# Patient Record
Sex: Male | Born: 1980 | Race: White | Hispanic: No | Smoking: Current every day smoker
Health system: Southern US, Community
[De-identification: ages and names within clinical notes are randomized; demographics above are authoritative.]

---

## 2014-03-05 ENCOUNTER — Encounter (HOSPITAL_COMMUNITY): Payer: Self-pay | Admitting: Emergency Medicine

## 2014-03-05 ENCOUNTER — Emergency Department (HOSPITAL_COMMUNITY)
Admission: EM | Admit: 2014-03-05 | Discharge: 2014-03-05 | Disposition: A | Discharge status: Home / Self Care | Payer: Self-pay | Attending: Emergency Medicine | Admitting: Emergency Medicine

## 2014-03-05 ENCOUNTER — Emergency Department (HOSPITAL_COMMUNITY): Payer: Self-pay

## 2014-03-05 DIAGNOSIS — Z880 Allergy status to penicillin: Secondary | ICD-10-CM | POA: Insufficient documentation

## 2014-03-05 DIAGNOSIS — R000 Tachycardia, unspecified: Secondary | ICD-10-CM | POA: Insufficient documentation

## 2014-03-05 DIAGNOSIS — R112 Nausea with vomiting, unspecified: Secondary | ICD-10-CM | POA: Insufficient documentation

## 2014-03-05 DIAGNOSIS — F17200 Nicotine dependence, unspecified, uncomplicated: Secondary | ICD-10-CM | POA: Insufficient documentation

## 2014-03-05 DIAGNOSIS — R059 Cough, unspecified: Secondary | ICD-10-CM | POA: Insufficient documentation

## 2014-03-05 DIAGNOSIS — J039 Acute tonsillitis, unspecified: Secondary | ICD-10-CM | POA: Insufficient documentation

## 2014-03-05 DIAGNOSIS — R062 Wheezing: Secondary | ICD-10-CM | POA: Insufficient documentation

## 2014-03-05 DIAGNOSIS — R05 Cough: Secondary | ICD-10-CM | POA: Insufficient documentation

## 2014-03-05 LAB — BASIC METABOLIC PANEL
ANION GAP: 11 (ref 5–15)
BUN: 11 mg/dL (ref 6–23)
CALCIUM: 9.5 mg/dL (ref 8.4–10.5)
CO2: 24 meq/L (ref 19–32)
CREATININE: 0.88 mg/dL (ref 0.50–1.35)
Chloride: 101 mEq/L (ref 96–112)
GFR calc non Af Amer: >90 mL/min (ref >90)
GLUCOSE: 94 mg/dL (ref 70–99)
Potassium: 4.6 mEq/L (ref 3.7–5.3)
Sodium: 136 mEq/L — ABNORMAL LOW (ref 137–147)

## 2014-03-05 LAB — RAPID STREP SCREEN (MED CTR MEBANE ONLY): STREPTOCOCCUS, GROUP A SCREEN (DIRECT): NEGATIVE

## 2014-03-05 LAB — CBC
HCT: 41.7 % (ref 39.0–52.0)
HEMOGLOBIN: 14.5 g/dL (ref 13.0–17.0)
MCH: 31 pg (ref 26.0–34.0)
MCHC: 34.8 g/dL (ref 30.0–36.0)
MCV: 89.1 fL (ref 78.0–100.0)
Platelets: 255 10*3/uL (ref 150–400)
RBC: 4.68 MIL/uL (ref 4.22–5.81)
RDW: 13.1 % (ref 11.5–15.5)
WBC: 13.1 10*3/uL — ABNORMAL HIGH (ref 4.0–10.5)

## 2014-03-05 MED ORDER — SODIUM CHLORIDE 0.9 % IV BOLUS (SEPSIS)
1000.0000 mL | Freq: Once | INTRAVENOUS | Status: AC
  Administered 2014-03-05: 1000 mL via INTRAVENOUS

## 2014-03-05 MED ORDER — ALBUTEROL SULFATE (2.5 MG/3ML) 0.083% IN NEBU
5.0000 mg | INHALATION_SOLUTION | Freq: Once | RESPIRATORY_TRACT | Status: AC
  Administered 2014-03-05: 5 mg via RESPIRATORY_TRACT
  Filled 2014-03-05: qty 6

## 2014-03-05 MED ORDER — ACETAMINOPHEN 325 MG PO TABS
650.0000 mg | ORAL_TABLET | Freq: Once | ORAL | Status: AC
  Administered 2014-03-05: 650 mg via ORAL
  Filled 2014-03-05: qty 2

## 2014-03-05 MED ORDER — ONDANSETRON 4 MG PO TBDP: ORAL_TABLET | ORAL | Status: AC

## 2014-03-05 MED ORDER — ALBUTEROL SULFATE HFA 108 (90 BASE) MCG/ACT IN AERS
2.0000 | INHALATION_SPRAY | Freq: Once | RESPIRATORY_TRACT | Status: AC
  Administered 2014-03-05: 2 via RESPIRATORY_TRACT
  Filled 2014-03-05: qty 6.7

## 2014-03-05 MED ORDER — AZITHROMYCIN 250 MG PO TABS: 250.0000 mg | ORAL_TABLET | Freq: Every day | ORAL | Status: AC

## 2014-03-05 NOTE — ED Notes (Signed)
Pt reports nv, cough x 3 days. sts he is unable to keep anything down and feels like he has pneumonia again. Pt speaking in full sentences. Skin warm and dry.

## 2014-03-05 NOTE — ED Provider Notes (Signed)
Medical screening examination/treatment/procedure(s) were performed by non-physician practitioner and as supervising physician I was immediately available for consultation/collaboration.   EKG Interpretation None        Norris Bodley, MD 03/05/14 0811 

## 2014-03-05 NOTE — Discharge Instructions (Signed)
Take zofran as directed as needed for nausea. Use albuterol inhaler every 4-6 hours for wheezing. Take antibiotic to completion.  Tonsillitis Tonsillitis is an infection of the throat that causes the tonsils to become red, tender, and swollen. Tonsils are collections of lymphoid tissue at the back of the throat. Each tonsil has crevices (crypts). Tonsils help fight nose and throat infections and keep infection from spreading to other parts of the body for the first 18 months of life.  CAUSES Sudden (acute) tonsillitis is usually caused by infection with streptococcal bacteria. Long-lasting (chronic) tonsillitis occurs when the crypts of the tonsils become filled with pieces of food and bacteria, which makes it easy for the tonsils to become repeatedly infected. SYMPTOMS  Symptoms of tonsillitis include:  A sore throat, with possible difficulty swallowing.  White patches on the tonsils.  Fever.  Tiredness.  New episodes of snoring during sleep, when you did not snore before.  Small, foul-smelling, yellowish-white pieces of material (tonsilloliths) that you occasionally cough up or spit out. The tonsilloliths can also cause you to have bad breath. DIAGNOSIS Tonsillitis can be diagnosed through a physical exam. Diagnosis can be confirmed with the results of lab tests, including a throat culture. TREATMENT  The goals of tonsillitis treatment include the reduction of the severity and duration of symptoms and prevention of associated conditions. Symptoms of tonsillitis can be improved with the use of steroids to reduce the swelling. Tonsillitis caused by bacteria can be treated with antibiotic medicines. Usually, treatment with antibiotic medicines is started before the cause of the tonsillitis is known. However, if it is determined that the cause is not bacterial, antibiotic medicines will not treat the tonsillitis. If attacks of tonsillitis are severe and frequent, your health care provider may  recommend surgery to remove the tonsils (tonsillectomy). HOME CARE INSTRUCTIONS   Rest as much as possible and get plenty of sleep.  Drink plenty of fluids. While the throat is very sore, eat soft foods or liquids, such as sherbet, soups, or instant breakfast drinks.  Eat frozen ice pops.  Gargle with a warm or cold liquid to help soothe the throat. Mix 1/4 teaspoon of salt and 1/4 teaspoon of baking soda in 8 oz of water. SEEK MEDICAL CARE IF:   Large, tender lumps develop in your neck.  A rash develops.  A green, yellow-brown, or bloody substance is coughed up.  You are unable to swallow liquids or food for 24 hours.  You notice that only one of the tonsils is swollen. SEEK IMMEDIATE MEDICAL CARE IF:   You develop any new symptoms such as vomiting, severe headache, stiff neck, chest pain, or trouble breathing or swallowing.  You have severe throat pain along with drooling or voice changes.  You have severe pain, unrelieved with recommended medications.  You are unable to fully open the mouth.  You develop redness, swelling, or severe pain anywhere in the neck.  You have a fever. MAKE SURE YOU:   Understand these instructions.  Will watch your condition.  Will get help right away if you are not doing well or get worse. Document Released: 03/09/2005 Document Revised: 10/14/2013 Document Reviewed: 11/16/2012 [facility] Patient Information 2015 [place], [place]. This information is not intended to replace advice given to you by your health care provider. Make sure you discuss any questions you have with your health care provider.

## 2014-03-05 NOTE — ED Provider Notes (Signed)
CSN: [id number]     Arrival date & time 03/05/14  7253 History   First MD Initiated Contact with Patient 03/05/14 [phone]     Chief Complaint  Patient presents with  . Emesis     (Consider location/radiation/quality/duration/timing/severity/associated sxs/prior Treatment) HPI Comments: Patient is a 33 year old male who presents to the emergency department complaining of productive cough with clear phlegm, sore throat, nausea, vomiting and body aches x3 days. Patient reports he is unable to keep anything down. States his sore throat radiates from the right side of his throat up to his ear with a lot of pressure behind his nose. Admits to wheezing, states this feels like when he had pneumonia in the past. Admits to subjective fever and chills. He has tried multiple over-the-counter medications with no relief. Denies abdominal pain. He is a smoker.  Patient is a 33 y.o. male presenting with vomiting. The history is provided by the patient.  Emesis Associated symptoms: chills     History reviewed. No pertinent past medical history. History reviewed. No pertinent past surgical history. No family history on file. History  Substance Use Topics  . Smoking status: Current Every Day Smoker  . Smokeless tobacco: Not on file  . Alcohol Use: No    Review of Systems  Constitutional: Positive for fever and chills.  Respiratory: Positive for cough and wheezing.   Gastrointestinal: Positive for nausea and vomiting.  All other systems reviewed and are negative.     Allergies  Penicillins  Home Medications   Prior to Admission medications   Medication Sig Start Date End Date Taking? Authorizing Provider  Phenylephrine-DM-GG-APAP (TYLENOL COLD/FLU SEVERE PO) Take 2 tablets by mouth daily as needed (cold).   Yes Historical Provider, MD  Pseudoeph-CPM-DM-APAP (THERAFLU FLU/COLD/COUGH PO) Take 1 Package by mouth daily as needed (cold).   Yes Historical Provider, MD  Pseudoeph-Doxylamine-DM-APAP  (NYQUIL PO) Take 30 mLs by mouth 2 (two) times daily as needed (cold).   Yes Historical Provider, MD  azithromycin (ZITHROMAX) 250 MG tablet Take 1 tablet (250 mg total) by mouth daily. Take first 2 tablets together, then 1 every day until finished. 03/05/14   Trevor Mace, PA-C  ondansetron (ZOFRAN ODT) 4 MG disintegrating tablet  ODT q4 hours prn nausea/vomit 03/05/14   Nickalos Petersen M Albert, PA-C   BP 117/57  Pulse 97  Temp(Src) 98.6 F (37 C) (Oral)  Resp 20  Ht  (1.803 m)  Wt 165 lb (74.844 kg)  BMI 23.02 kg/m2  SpO2 96% Physical Exam  Nursing note and vitals reviewed. Constitutional: He is oriented to person, place, and time. He appears well-developed and well-nourished. No distress.  HENT:  Head: Normocephalic and atraumatic.  Mouth/Throat: Oropharynx is clear and moist.  Tonsils enlarged and inflamed bilateral with right sided exudate. No tonsillar abscess. Uvula midline.  Eyes: Conjunctivae are normal.  Neck: Normal range of motion. Neck supple.  Cardiovascular: Regular rhythm and normal heart sounds.   Tachycardia.  Pulmonary/Chest: Effort normal.  Scattered expiratory wheezes bilateral.  Abdominal: Soft. Bowel sounds are normal. There is no tenderness.  Musculoskeletal: Normal range of motion. He exhibits no edema.  Lymphadenopathy:    He has cervical adenopathy.  Neurological: He is alert and oriented to person, place, and time.  Skin: Skin is warm and dry. He is not diaphoretic.  Psychiatric: He has a normal mood and affect. His behavior is normal.    ED Course  Procedures (including critical care time) Labs Review Labs Reviewed  CBC -  Abnormal; Notable for the following:    WBC 13.1 (*)    All other components within normal limits  BASIC METABOLIC PANEL - Abnormal; Notable for the following:    Sodium 136 (*)    All other components within normal limits  RAPID STREP SCREEN  CULTURE, GROUP A STREP    Imaging Review Dg Chest 2 View  03/05/2014    CLINICAL DATA:  Emesis.  History of pneumonia.  EXAM: CHEST  2 VIEW  COMPARISON:  None.  FINDINGS: Normal heart size and mediastinal contours. No acute infiltrate or edema. No effusion or pneumothorax. No acute osseous findings.  IMPRESSION: No active cardiopulmonary disease.   Electronically Signed   By: Tiburcio Pea M.D.   On: 03/05/2014 03:57     EKG Interpretation None      MDM   Final diagnoses:  Exudative tonsillitis   Pt non-toxic appearing and in NAD. Afebrile, tachycardic, vitals otherwise stable. Labs, CXR obtained prior to pt being seen. Leukocytosis of 13.1, no other significant findings. CXR without any acute findings. Pt given IV fluids and neb treatment, states he feels more hydrated, and on repeat exam, breath sounds improved. HR improved. Given pt has tonsillar exudate, adenopathy, leukocytosis, will treat for strep despite negative rapid strep. PCN allergy, treat with azithro. Stable for d/c. Return precautions given. Patient states understanding of treatment care plan and is agreeable.  Trevor Mace, PA-C 03/05/14 [phone]

## 2014-03-05 NOTE — ED Notes (Addendum)
Pt c/o n/v and cough x 2 days.  Pt reports he's unable to eat.  Cough is productive with clear sputum.  Pt states that from his nose, down to throat, and inside ears feel raw.  Denies SOB except when coughing.  Pt reports mild chest pain in mid chest when coughing.  Pt reports chills.  Pt reports taking nyquil, tylenol cold and flu, and ibuprofen at home, but symptoms have persisted and gotten worse.   Pt reports general body aches. NAD, respirations equal and unlabored, skin warm and dry.

## 2014-03-07 LAB — CULTURE, GROUP A STREP

## 2016-03-01 IMAGING — CR DG CHEST 2V
2 series · 2 of 2 positions shown · non-contrast
Comparison: None.

CLINICAL DATA: Emesis.  History of pneumonia.

EXAM:
CHEST  2 VIEW

[w chest pa]
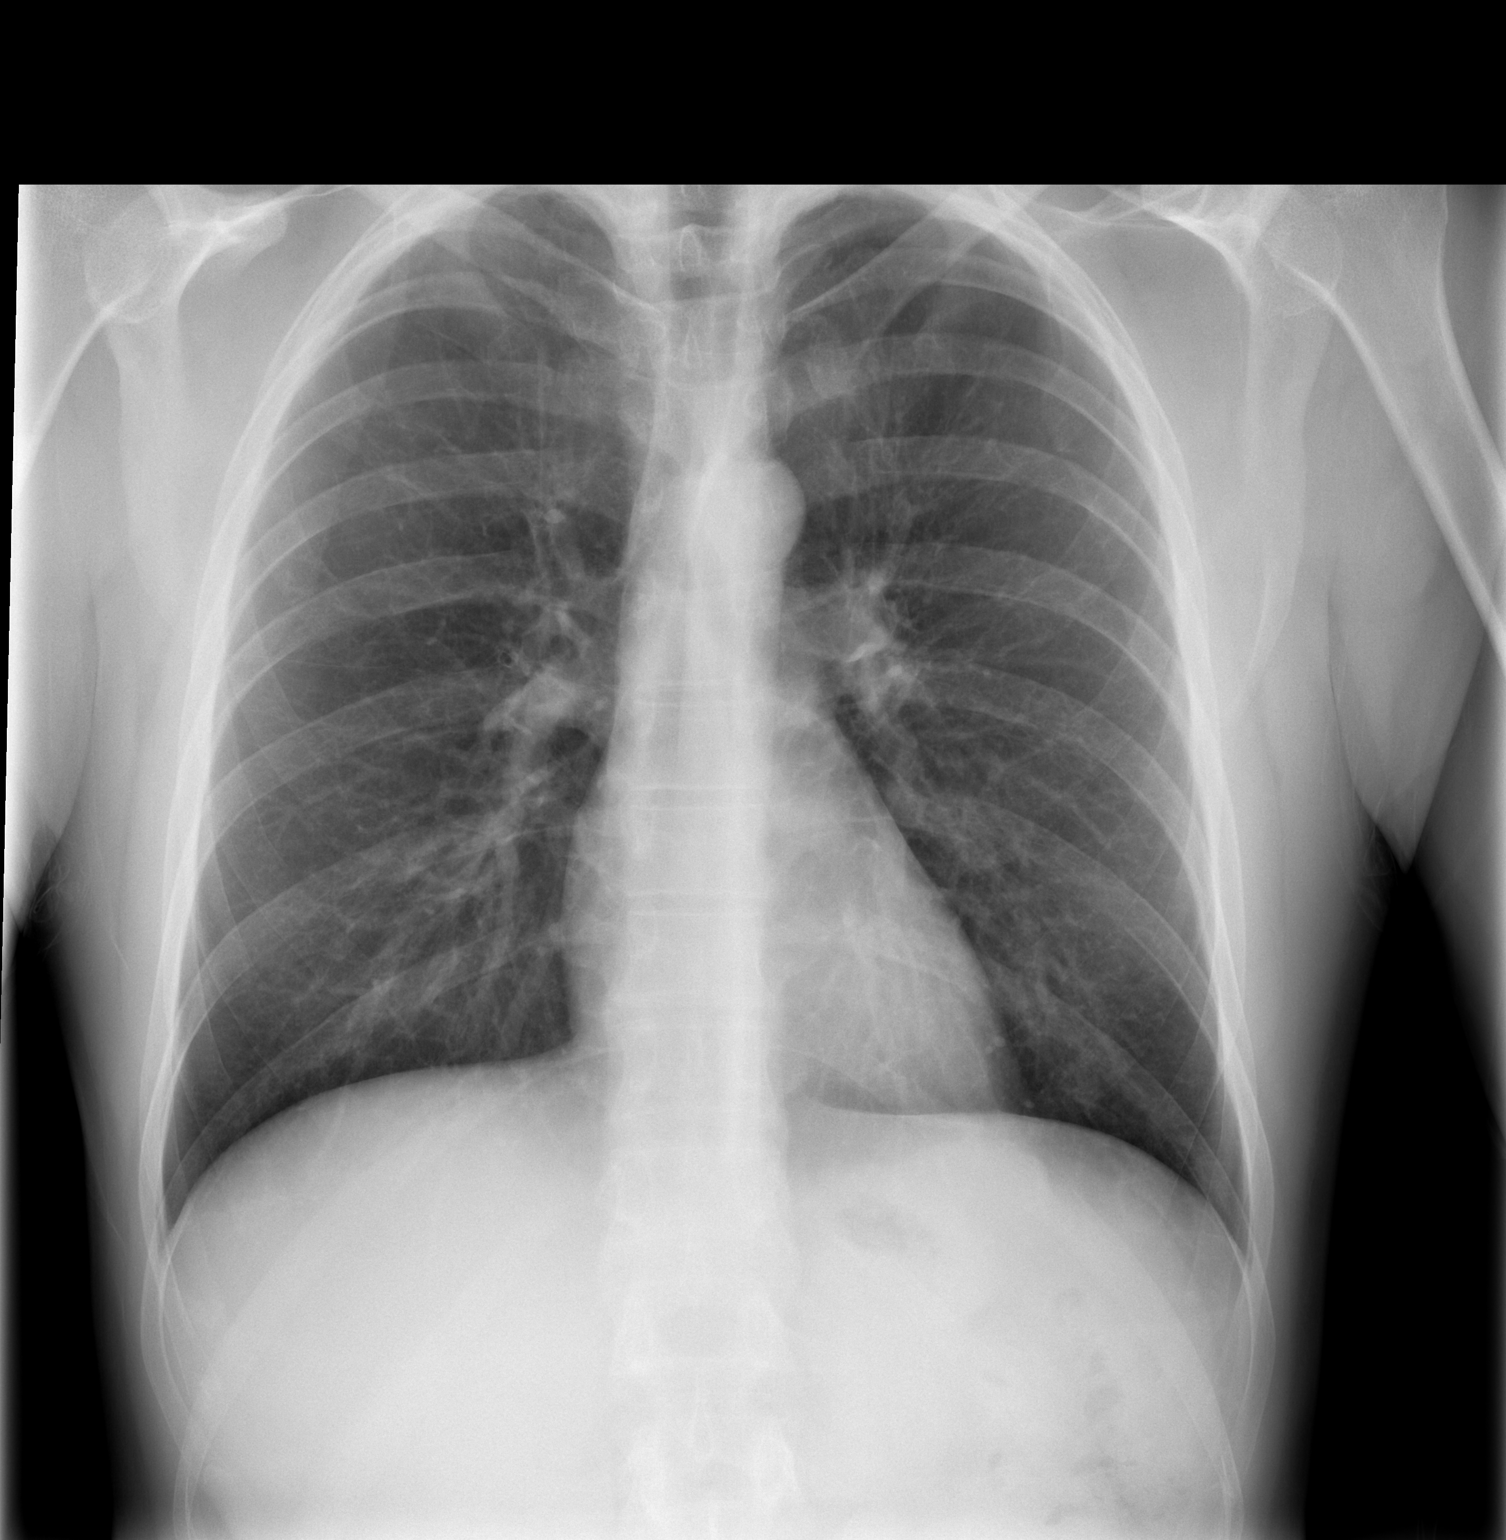

[w chest lat]
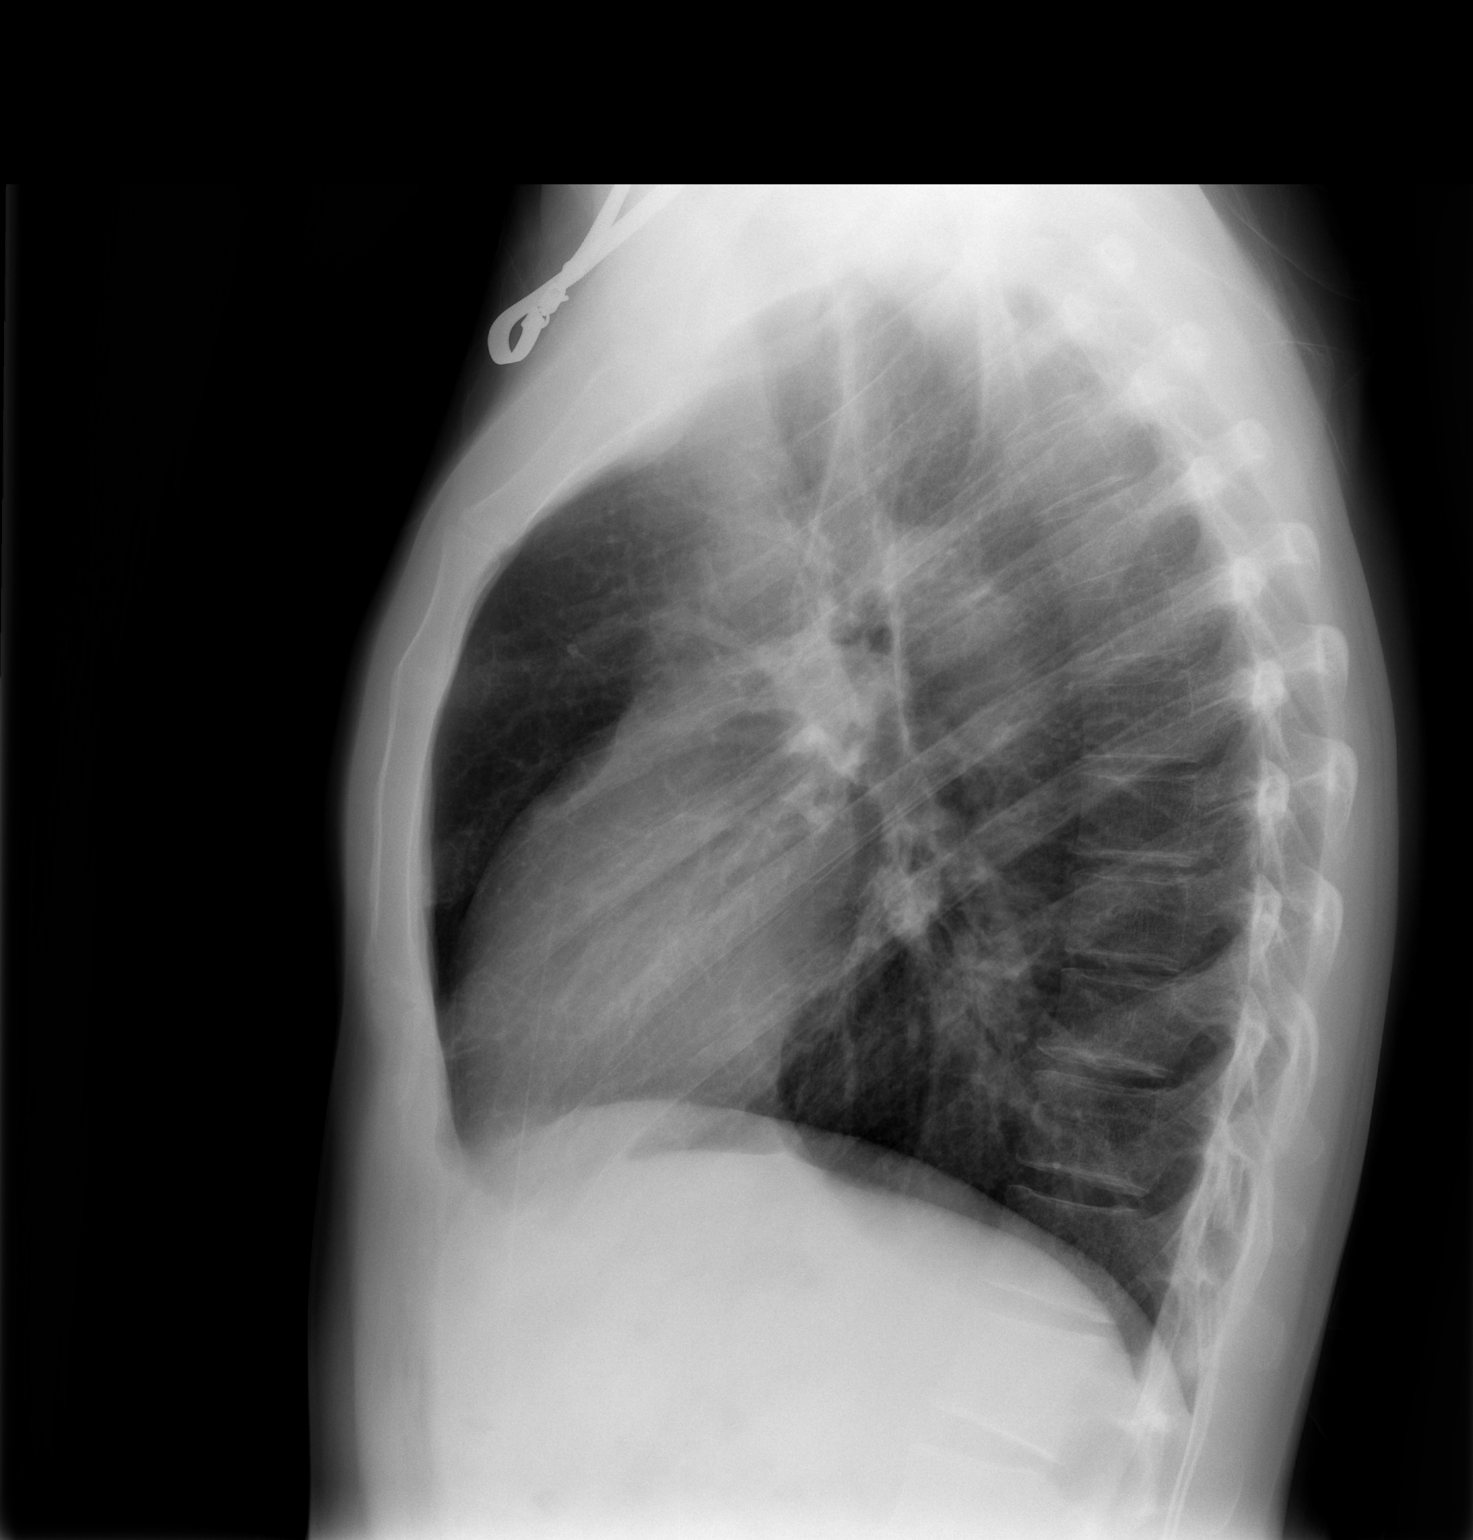

[2 of 2 positions shown; findings below may reference images not displayed]

FINDINGS: Normal heart size and mediastinal contours. No acute infiltrate or
edema. No effusion or pneumothorax. No acute osseous findings.
IMPRESSION: No active cardiopulmonary disease.
